# Patient Record
Sex: Female | Born: 1990 | Race: Black or African American | Hispanic: No | Marital: Single | Smoking: Never smoker
Health system: Southern US, Community
[De-identification: ages and names within clinical notes are randomized; demographics above are authoritative.]

## PROBLEM LIST (undated history)

## (undated) DIAGNOSIS — J309 Allergic rhinitis, unspecified: Secondary | ICD-10-CM

## (undated) HISTORY — DX: Allergic rhinitis, unspecified: J30.9

---
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
## 2012-08-20 ENCOUNTER — Ambulatory Visit (INDEPENDENT_AMBULATORY_CARE_PROVIDER_SITE_OTHER): Payer: Managed Care, Other (non HMO) | Admitting: Internal Medicine

## 2012-08-20 ENCOUNTER — Encounter: Payer: Self-pay | Admitting: Internal Medicine

## 2012-08-20 VITALS — BP 114/82 | HR 86 | Temp 98.5°F | Wt 156.0 lb

## 2012-08-20 DIAGNOSIS — J029 Acute pharyngitis, unspecified: Secondary | ICD-10-CM

## 2012-08-20 LAB — POCT RAPID STREP A (OFFICE): Rapid Strep A Screen: NEGATIVE

## 2012-08-20 NOTE — Patient Instructions (Addendum)
This is probably a viral sore throat that will get better on its own with time  Cough may get worse before better. Will let you know about culture results tpo make sure no strep infection.  Gargles tylenol or ibuprofen  type meds for comfort .  If  persistent or progressive  Over weeks or fever returns contact us for re evaluation.

## 2012-08-20 NOTE — Progress Notes (Signed)
Chief Complaint  Patient presents with  . Sore Throat    Was having headache, chest congestion and generalized body aches.  These have resolved.  Sore throat continues.    HPI: Patient comes in today for SDA for  new problem evaluation. Onset  Less  Than a week  4 days onset.  Taking  Alka seltzer cough and cold  Helps with head and body aches.  Sore throat to swallow  And cough    Cough not at niught.  nosterp exposure  [employer] ft and owrks grocery store   No real runny nose  But has body aches  ROS: See pertinent positives and negatives per HPI. No rashes   Past Medical History  Diagnosis Date  . Allergy     Family History  Problem Relation Age of Onset  . Heart disease    . Diabetes    . Allergies    . Nocturnal enuresis      History   Social History  . Marital Status: Single    Spouse Name: N/A    Number of Children: N/A  . Years of Education: N/A   Social History Main Topics  . Smoking status: Never Smoker   . Smokeless tobacco: Never Used  . Alcohol Use: No  . Drug Use: No  . Sexually Active: None   Other Topics Concern  . None   Social History Narrative  . None    Outpatient Encounter Prescriptions as of 08/20/2012  Medication Sig Dispense Refill  . albuterol (PROVENTIL HFA;VENTOLIN HFA) 108 (90 BASE) MCG/ACT inhaler Inhale 2 puffs into the lungs every 6 (six) hours as needed.       No facility-administered encounter medications on file as of 08/20/2012.    EXAM:  BP 114/82  Pulse 86  Temp(Src) 98.5 F (36.9 C) (Oral)  Wt 156 lb (70.761 kg)  SpO2 99%  LMP 08/04/2012  There is no height on file to calculate BMI.  GENERAL: vitals reviewed and listed above, alert, oriented, appears well hydrated and in no acute distress  HEENT: atraumatic, conjunctiva  clear, no obvious abnormalities on inspection of external nose and ears  Face nt  tms clear  OP : no lesion edema or exudate  2 + red  Cobblestoning no petechia  NECK: no obvious masses on inspection  palpation  Shoddy ac nodes and pc nodes  LUNGS: clear to auscultation bilaterally, no wheezes, rales or rhonchi, good air movement Skin nl turgor and no rash . CV: HRRR, no clubbing cyanosis or  peripheral edema nl cap refill  MS: moves all extremities without noticeable focal  abnormality PSYCH: pleasant and cooperative, no obvious depression or anxiety  ASSESSMENT AND PLAN:  Discussed the following assessment and plan:  Acute pharyngitis - Plan: Culture, Group A Strep, POCT rapid strep A  -Patient advised to return or notify health care team  if symptoms worsen or persist or new concerns arise.  Patient Instructions  This is probably a viral sore throat that will get better on its own with time  Cough may get worse before better. Will let you know about culture results tpo make sure no strep infection.  Gargles tylenol or ibuprofen  type meds for comfort .  If  persistent or progressive  Over weeks or fever returns contact us for re evaluation.    Neta Mends. Vito Beg M.D.

## 2012-08-22 LAB — CULTURE, GROUP A STREP: Organism ID, Bacteria: NORMAL

## 2013-06-09 ENCOUNTER — Ambulatory Visit (INDEPENDENT_AMBULATORY_CARE_PROVIDER_SITE_OTHER): Payer: BC Managed Care – PPO | Admitting: Internal Medicine

## 2013-06-09 ENCOUNTER — Ambulatory Visit (INDEPENDENT_AMBULATORY_CARE_PROVIDER_SITE_OTHER)
Admission: RE | Admit: 2013-06-09 | Discharge: 2013-06-09 | Disposition: A | Discharge status: Home / Self Care | Payer: BC Managed Care – PPO | Source: Ambulatory Visit | Attending: Internal Medicine | Admitting: Internal Medicine

## 2013-06-09 ENCOUNTER — Encounter: Payer: Self-pay | Admitting: Internal Medicine

## 2013-06-09 VITALS — BP 120/76 | HR 72 | Temp 98.0°F | Ht 64.75 in | Wt 150.0 lb

## 2013-06-09 DIAGNOSIS — R0602 Shortness of breath: Secondary | ICD-10-CM

## 2013-06-09 MED ORDER — PREDNISONE 20 MG PO TABS: ORAL_TABLET | ORAL | Status: DC

## 2013-06-09 MED ORDER — ALBUTEROL SULFATE HFA 108 (90 BASE) MCG/ACT IN AERS: 2.0000 | INHALATION_SPRAY | Freq: Four times a day (QID) | RESPIRATORY_TRACT | Status: DC | PRN

## 2013-06-09 NOTE — Progress Notes (Signed)
Pre visit review using our clinic review tool, if applicable. No additional management support is needed unless otherwise documented below in the visit note.   Chief Complaint  Patient presents with  . Shortness of Breath    Complains of a tightness in her chest.  Happens at any part of the day.  Ongoing for several weeks.    HPI: Patient comes in today for SDA for  new problem evaluation. Onset about 2 weeks ago and was going tob ed and was hard to breathing  And heart racing  No cough . At initiation but chest heaviness  Now has some nocturnal cough  But no itching sneezing  Just started  A new job [employer].   Could have increase stress Pattern is about every day comes and goes.  No triggers  Not nocturnally ? hasn't really exercised  Tried allergy medicine . Claritin .   Tried albuterol  Temporary help for about 20 - 30 minutes No recent ur allergy sx.  ROS: See pertinent positives and negatives per HPI.  No cp hemoptysis lmp 3 days ago not pregnant  No sig ets. But sister smokes  Albuterol a year ago. Programme researcher, broadcasting/film/videoBathroom  Cleaner irritatve.  Past Medical History  Diagnosis Date  . Allergic rhinitis, cause unspecified     Family History  Problem Relation Age of Onset  . Heart disease    . Diabetes    . Allergies    . Nocturnal enuresis      History   Social History  . Marital Status: Single    Spouse Name: N/A    Number of Children: N/A  . Years of Education: N/A   Social History Main Topics  . Smoking status: Never Smoker   . Smokeless tobacco: Never Used  . Alcohol Use: No  . Drug Use: No  . Sexual Activity: None   Other Topics Concern  . 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 [DISCONTINUED] albuterol (PROVENTIL HFA;VENTOLIN HFA) 108 (90 BASE) MCG/ACT inhaler Inhale 2 puffs into  the lungs every 6 (six) hours as needed.  Marland Kitchen. albuterol (PROAIR HFA) 108 (90 BASE) MCG/ACT inhaler Inhale 2 puffs into the lungs every 6 (six) hours as needed for wheezing or shortness of breath.  . predniSONE (DELTASONE) 20 MG tablet Take 3 po qd for 2 days then 2 po qd for 3 days,or as directed    EXAM:  BP 120/76  Pulse 72  Temp(Src) 98 F (36.7 C) (Oral)  Ht 5' 4.75" (1.645 m)  Wt 150 lb (68.04 kg)  BMI 25.14 kg/m2  SpO2 98%  LMP 06/02/2013  Body mass index is 25.14 kg/(m^2).  GENERAL: vitals reviewed and listed above, alert, oriented, appears well hydrated and in no acute distress HEENT: atraumatic, conjunctiva  clear, no obvious abnormalities on inspection of external nose and ears OP : no lesion edema or exudate  NECK: no obvious masses on inspection palpation  LUNGS: clear to auscultation bilaterally, no wheezes, rales or rhonchi, good air movement  ocass dry cough  CV: HRRR, no g or m  no clubbing cyanosis or  peripheral edema nl cap refill  MS: moves all extremities without noticeable focal  abnormality PSYCH: pleasant and cooperative, no obvious depression or  anxiety  ASSESSMENT AND PLAN:  Discussed the following assessment and plan:  Shortness of breath - prob asthmatic   other causes possible  close fu if persisten or relapsing no evidence of cv  abn on exam  today  - Plan: DG Chest 2 View  -Patient advised to return or notify health care team  if symptoms worsen ,persist or new concerns arise.  Patient Instructions  Get chest x ray today  Will let you know results. Should be normal Treat for a  Form of asthma  5 days of prednisone to decrease  Inflammation in bronchial tubes  That can cause wheezing and asthma symptoms.  Other causes can be stress also . If   persistent or progressive then return for OV  For other management    Neta MendsWanda K. Panosh M.D.

## 2013-06-09 NOTE — Patient Instructions (Signed)
Get chest x ray today  Will let you know results. Should be normal Treat for a  Form of asthma  5 days of prednisone to decrease  Inflammation in bronchial tubes  That can cause wheezing and asthma symptoms.  Other causes can be stress also . If   persistent or progressive then return for OV  For other management

## 2013-06-09 NOTE — Progress Notes (Signed)
Quick Note:  Tell patient that x ray shows no acute abnormality. Proceed with the prednisone And inhaler as we discussed ______

## 2013-06-12 ENCOUNTER — Encounter: Payer: Self-pay | Admitting: Family Medicine

## 2013-06-13 ENCOUNTER — Telehealth: Payer: Self-pay

## 2013-06-13 NOTE — Telephone Encounter (Signed)
Pt aware xray normal

## 2014-05-20 ENCOUNTER — Telehealth: Payer: Self-pay | Admitting: Internal Medicine

## 2014-05-20 MED ORDER — ALBUTEROL SULFATE HFA 108 (90 BASE) MCG/ACT IN AERS: 2.0000 | INHALATION_SPRAY | Freq: Four times a day (QID) | RESPIRATORY_TRACT | Status: DC | PRN

## 2014-05-20 NOTE — Telephone Encounter (Signed)
Refilled for 1 inhaler.

## 2014-05-20 NOTE — Telephone Encounter (Signed)
Pt request refill albuterol (PROAIR HFA) 108 (90 BASE) MCG/ACT inhaler [facility]/ [address]  Advise pt she needed appt, but she wants to check her work first.  Not seen since  05/2013

## 2014-09-27 IMAGING — CR DG CHEST 2V
2 series · 2 of 2 positions shown · non-contrast
Comparison: None.

CLINICAL DATA: Cough, shortness of Breath

EXAM:
CHEST  2 VIEW

[view not recorded (1 of 2)]
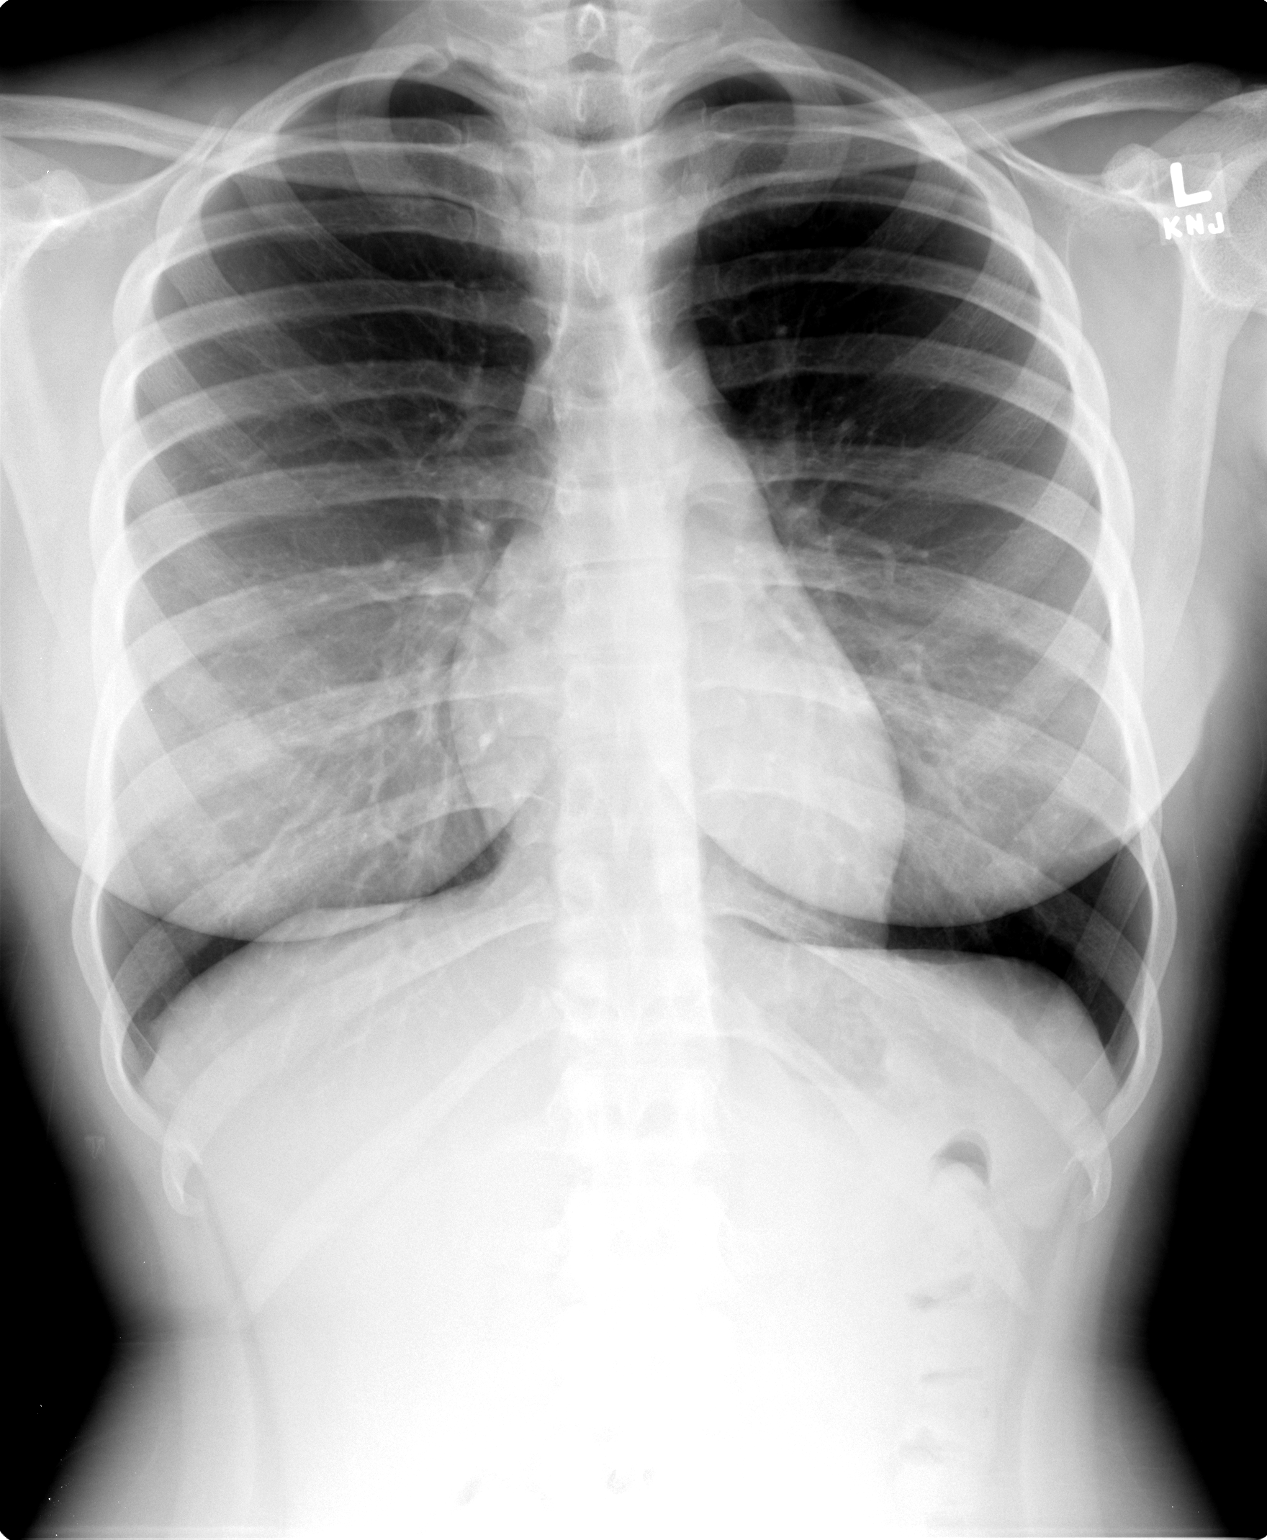

[view not recorded (2 of 2)]
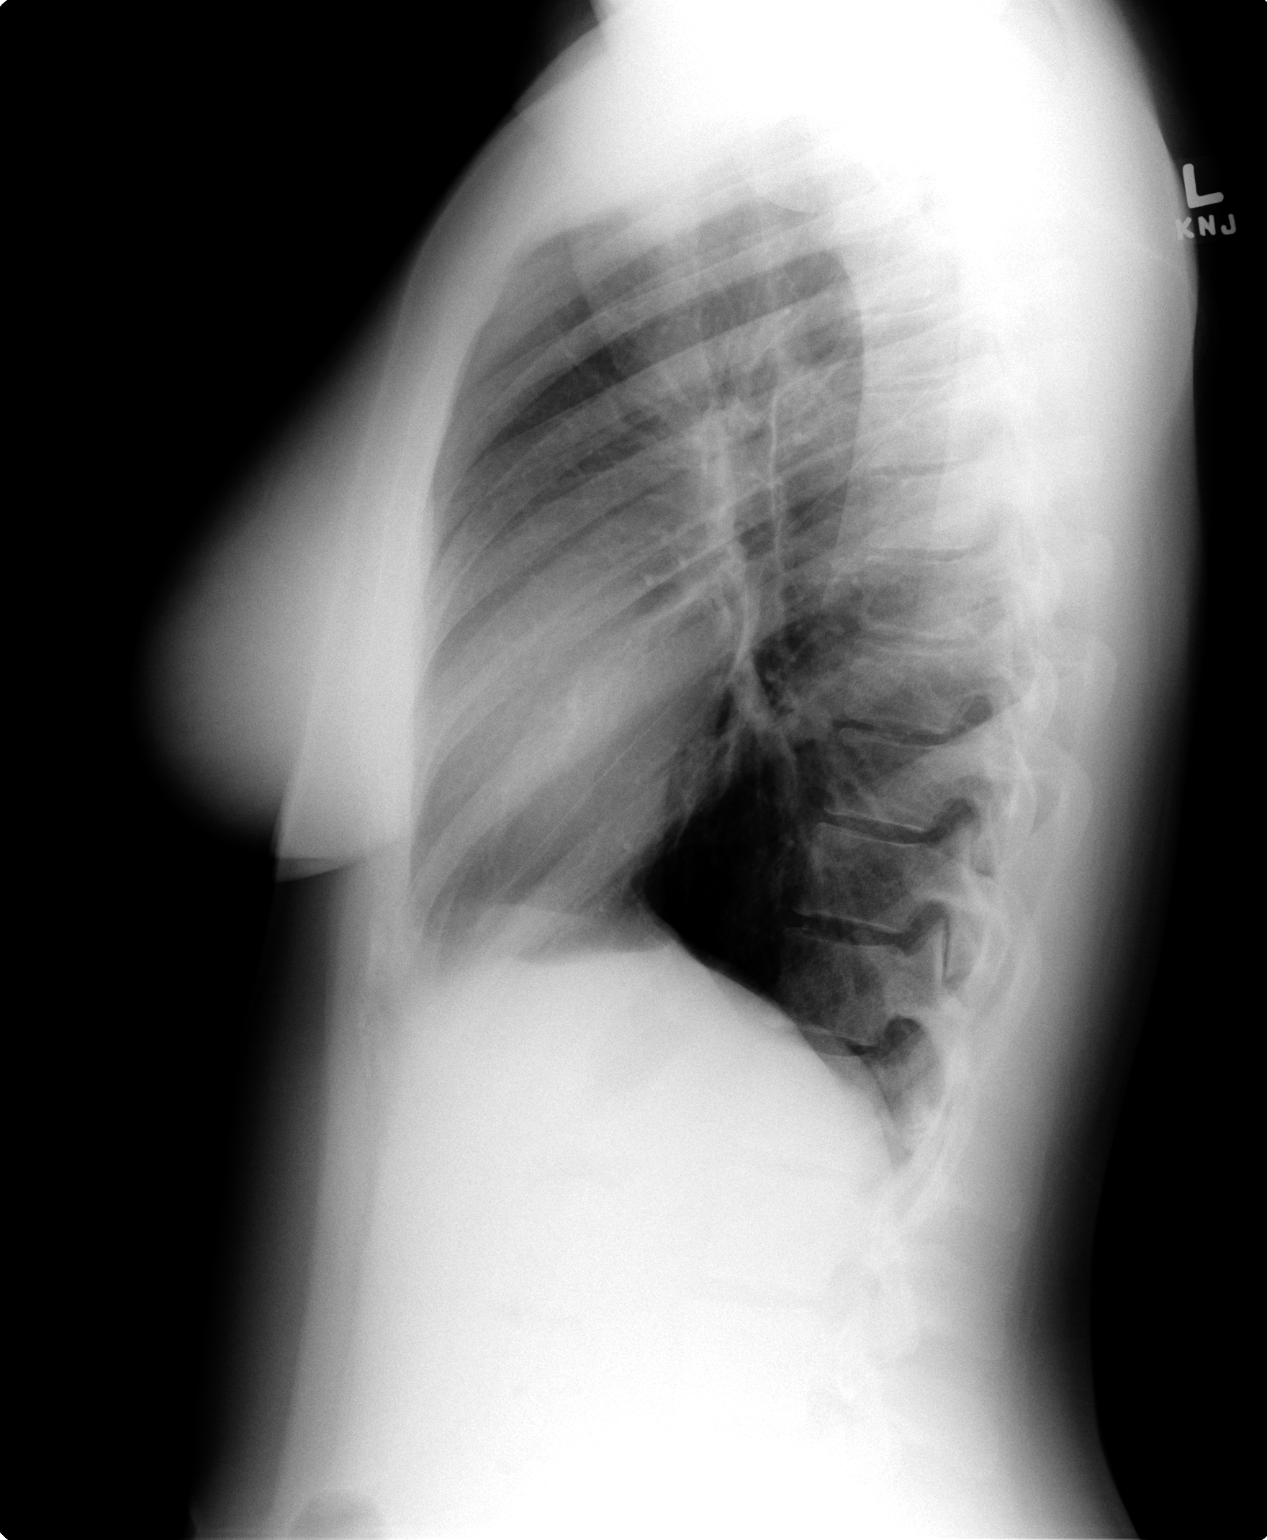

[2 of 2 positions shown; findings below may reference images not displayed]

FINDINGS: Cardiomediastinal silhouette is unremarkable. No acute infiltrate or
pleural effusion. No pulmonary edema. Bony thorax is unremarkable.
IMPRESSION: No active cardiopulmonary disease.

## 2015-05-29 ENCOUNTER — Emergency Department (HOSPITAL_COMMUNITY)
Admission: EM | Admit: 2015-05-29 | Discharge: 2015-05-30 | Disposition: A | Discharge status: Home / Self Care | Payer: BLUE CROSS/BLUE SHIELD | Attending: Emergency Medicine | Admitting: Emergency Medicine

## 2015-05-29 ENCOUNTER — Encounter (HOSPITAL_COMMUNITY): Payer: Self-pay | Admitting: Oncology

## 2015-05-29 DIAGNOSIS — Z79899 Other long term (current) drug therapy: Secondary | ICD-10-CM | POA: Insufficient documentation

## 2015-05-29 DIAGNOSIS — R21 Rash and other nonspecific skin eruption: Secondary | ICD-10-CM | POA: Diagnosis present

## 2015-05-29 DIAGNOSIS — L509 Urticaria, unspecified: Secondary | ICD-10-CM | POA: Diagnosis not present

## 2015-05-29 NOTE — ED Notes (Signed)
Pt presents to the ED with c/o of allergic reaction and hives Pt states that yesterday she had soy milk and then started having the reaction Pt took Benadryl this morning with some relief Pt is not experiencing SOB, difficulty swallowing/speaking, or tongue swelling Pt does have hives to her face and arms Pt in NAD

## 2015-05-30 MED ORDER — DIPHENHYDRAMINE HCL 25 MG PO TABS: 25.0000 mg | ORAL_TABLET | Freq: Four times a day (QID) | ORAL | Status: AC | PRN

## 2015-05-30 MED ORDER — FAMOTIDINE 20 MG PO TABS
20.0000 mg | ORAL_TABLET | Freq: Once | ORAL | Status: AC
  Administered 2015-05-30: 20 mg via ORAL
  Filled 2015-05-30: qty 1

## 2015-05-30 MED ORDER — FAMOTIDINE 20 MG PO TABS: 20.0000 mg | ORAL_TABLET | Freq: Two times a day (BID) | ORAL | Status: AC

## 2015-05-30 MED ORDER — PREDNISONE 20 MG PO TABS: 40.0000 mg | ORAL_TABLET | Freq: Every day | ORAL | Status: AC

## 2015-05-30 MED ORDER — DIPHENHYDRAMINE HCL 25 MG PO CAPS
25.0000 mg | ORAL_CAPSULE | Freq: Once | ORAL | Status: AC
  Administered 2015-05-30: 25 mg via ORAL
  Filled 2015-05-30: qty 1

## 2015-05-30 MED ORDER — PREDNISONE 20 MG PO TABS
60.0000 mg | ORAL_TABLET | Freq: Once | ORAL | Status: AC
  Administered 2015-05-30: 60 mg via ORAL
  Filled 2015-05-30: qty 3

## 2015-05-30 MED ORDER — HYDROCORTISONE 2.5 % EX LOTN: TOPICAL_LOTION | Freq: Two times a day (BID) | CUTANEOUS | Status: AC

## 2015-05-30 NOTE — Discharge Instructions (Signed)
1. Medications: Prednisone, Benadryl, Pepcid, hydrocortisone lotion, usual home medications 2. Treatment: rest, drink plenty of fluids, take medications as prescribed 3. Follow Up: Please followup with your primary doctor in 3 days for discussion of your diagnoses and further evaluation after today's visit; if you do not have a primary care doctor use the resource guide provided to find one; followup with dermatology as needed; Return to the ER for difficulty breathing, return of allergic reaction or other concerning symptoms    Hives Hives are itchy, red, swollen areas of the skin. They can vary in size and location on your body. Hives can come and go for hours or several days (acute hives) or for several weeks (chronic hives). Hives do not spread from person to person (noncontagious). They may get worse with scratching, exercise, and emotional stress. CAUSES   Allergic reaction to food, additives, or drugs.  Infections, including the common cold.  Illness, such as vasculitis, lupus, or thyroid disease.  Exposure to sunlight, heat, or cold.  Exercise.  Stress.  Contact with chemicals. SYMPTOMS   Red or white swollen patches on the skin. The patches may change size, shape, and location quickly and repeatedly.  Itching.  Swelling of the hands, feet, and face. This may occur if hives develop deeper in the skin. DIAGNOSIS  Your caregiver can usually tell what is wrong by performing a physical exam. Skin or blood tests may also be done to determine the cause of your hives. In some cases, the cause cannot be determined. TREATMENT  Mild cases usually get better with medicines such as antihistamines. Severe cases may require an emergency epinephrine injection. If the cause of your hives is known, treatment includes avoiding that trigger.  HOME CARE INSTRUCTIONS   Avoid causes that trigger your hives.  Take antihistamines as directed by your caregiver to reduce the severity of your  hives. Non-sedating or low-sedating antihistamines are usually recommended. Do not drive while taking an antihistamine.  Take any other medicines prescribed for itching as directed by your caregiver.  Wear loose-fitting clothing.  Keep all follow-up appointments as directed by your caregiver. SEEK MEDICAL CARE IF:   You have persistent or severe itching that is not relieved with medicine.  You have painful or swollen joints. SEEK IMMEDIATE MEDICAL CARE IF:   You have a fever.  Your tongue or lips are swollen.  You have trouble breathing or swallowing.  You feel tightness in the throat or chest.  You have abdominal pain. These problems may be the first sign of a life-threatening allergic reaction. Call your local emergency services (911 in [place] MAKE SURE YOU:   Understand these instructions.  Will watch your condition.  Will get help right away if you are not doing well or get worse.   This information is not intended to replace advice given to you by your health care provider. Make sure you discuss any questions you have with your health care provider.   Document Released: 02/06/2005 Document Revised: 02/11/2013 Document Reviewed: 05/02/2011 Elsevier Interactive Patient Education [employer]2016 Elsevier Inc.

## 2015-05-30 NOTE — ED Provider Notes (Signed)
CSN: [id number]     Arrival date & time 05/29/15  2309 History   First MD Initiated Contact with Patient 05/30/15 0008     Chief Complaint  Patient presents with  . Allergic Reaction  . Urticaria     (Consider location/radiation/quality/duration/timing/severity/associated sxs/prior Treatment) The history is provided by the patient and medical records. No language interpreter was used.     Melanie Bradford is a 25 y.o. female  with a hx of allergic rhinitis presents to the Emergency Department complaining of gradual, persistent, progressively worsening rash and itching onset 10pm last night, 2 hours after drinking soy milk.  She reports she has had soymilk in the past without a reaction.  She reports taking benadryl which improves the symptoms, but they return in 3-4 hours as the medication begins to wear off.  Pt denies known allergies.  Denies difficulty breathing, swallowing, vomiting, wheezing.  She reports no other associated symptoms.  Nothing makes the symptoms worse. She reports she does not have new hair, makeup, laundry or other environmental products.    Past Medical History  Diagnosis Date  . Allergic rhinitis, cause unspecified    History reviewed. No pertinent past surgical history. Family History  Problem Relation Age of Onset  . Heart disease    . Diabetes    . Allergies    . Nocturnal enuresis     Social History  Substance Use Topics  . Smoking status: Never Smoker   . Smokeless tobacco: Never Used  . Alcohol Use: Yes   OB History    No data available     Review of Systems  Constitutional: Negative for fever, diaphoresis, appetite change, fatigue and unexpected weight change.  HENT: Negative for mouth sores.   Eyes: Negative for visual disturbance.  Respiratory: Negative for cough, chest tightness, shortness of breath and wheezing.   Cardiovascular: Negative for chest pain.  Gastrointestinal: Negative for nausea, vomiting, abdominal pain, diarrhea and  constipation.  Endocrine: Negative for polydipsia, polyphagia and polyuria.  Genitourinary: Negative for dysuria, urgency, frequency and hematuria.  Musculoskeletal: Negative for back pain and neck stiffness.  Skin: Positive for rash.  Allergic/Immunologic: Negative for immunocompromised state.  Neurological: Negative for syncope, light-headedness and headaches.  Hematological: Does not bruise/bleed easily.  Psychiatric/Behavioral: Negative for sleep disturbance. The patient is not nervous/anxious.       Allergies  Review of patient's allergies indicates no known allergies.  Home Medications   Prior to Admission medications   Medication Sig Start Date End Date Taking? Authorizing Provider  hydroxypropyl methylcellulose / hypromellose (ISOPTO TEARS / GONIOVISC) 2.5 % ophthalmic solution Place 1 drop into both eyes 3 (three) times daily as needed for dry eyes.   Yes Historical Provider, MD  ibuprofen (ADVIL,MOTRIN) 200 MG tablet Take 400 mg by mouth every 6 (six) hours as needed for moderate pain.   Yes Historical Provider, MD  Multiple Vitamin (MULTIVITAMIN WITH MINERALS) TABS tablet Take 1 tablet by mouth daily.   Yes Historical Provider, MD  diphenhydrAMINE (BENADRYL) 25 MG tablet Take 1 tablet (25 mg total) by mouth every 6 (six) hours as needed for itching (Rash). 05/30/15   Bellatrix Devonshire, PA-C  famotidine (PEPCID) 20 MG tablet Take 1 tablet (20 mg total) by mouth 2 (two) times daily. 05/30/15   Bambie Pizzolato, PA-C  hydrocortisone 2.5 % lotion Apply topically 2 (two) times daily. 05/30/15   Emelin Dascenzo, PA-C  predniSONE (DELTASONE) 20 MG tablet Take 2 tablets (40 mg total) by mouth daily. 05/30/15  Diontre Harps, PA-C   BP 126/70 mmHg  Pulse 69  Temp(Src) 98.8 F (37.1 C) (Oral)  Resp 15  Ht  (1.626 m)  Wt 72.576 kg  BMI 27.45 kg/m2  SpO2 100%  LMP 05/09/2015 Physical Exam  Constitutional: She is oriented to person, place, and time. She appears  well-developed and well-nourished. No distress.  HENT:  Head: Normocephalic and atraumatic.  Right Ear: Tympanic membrane, external ear and ear canal normal.  Left Ear: Tympanic membrane, external ear and ear canal normal.  Nose: Nose normal. No mucosal edema or rhinorrhea.  Mouth/Throat: Uvula is midline. No uvula swelling. No oropharyngeal exudate, posterior oropharyngeal edema, posterior oropharyngeal erythema or tonsillar abscesses.  No swelling of the uvula or oropharynx   Eyes: Conjunctivae are normal.  Neck: Normal range of motion.  Patent airway No stridor; normal phonation Handling secretions without difficulty  Cardiovascular: Normal rate, normal heart sounds and intact distal pulses.   No murmur heard. Pulmonary/Chest: Effort normal and breath sounds normal. No stridor. No respiratory distress. She has no wheezes.  No wheezes or rhonchi  Abdominal: Soft. Bowel sounds are normal. There is no tenderness.  Musculoskeletal: Normal range of motion. She exhibits no edema.  Neurological: She is alert and oriented to person, place, and time.  Skin: Skin is warm and dry. Rash noted. She is not diaphoretic.  Urticaria noted Mild excoriations - no induration or fluctuance to indicate secondary infection  Psychiatric: She has a normal mood and affect.  Nursing note and vitals reviewed.   ED Course  Procedures (including critical care time)   MDM   Final diagnoses:  Urticaria   Melanie Bradford presents with urticaria x24 hours intermittently improved with benadryl.  No airway involvement.  No 2nd system.  No evidence of anaphylaxis.    1:01 AM Patient re-evaluated prior to dc, is hemodynamically stable, in no respiratory distress, and denies the feeling of throat closing. Pt has been advised to take OTC benadryl, pepcid, prednisone and use hydrocortisone & return to the ED if they have a mod-severe allergic rxn (s/s including throat closing, difficulty breathing, swelling of  lips face or tongue). Pt is to follow up with their PCP. Pt is agreeable with plan & verbalizes understanding.    Dierdre Forth, PA-C 05/30/15 0104  April Palumbo, MD 05/30/15 [phone]

## 2015-05-31 ENCOUNTER — Telehealth: Payer: Self-pay | Admitting: Internal Medicine

## 2015-05-31 NOTE — Telephone Encounter (Signed)
Pt need a ER fup Dr Fabian SharpPanosh does not have 30 minutes available except same day. Do I use those?

## 2015-05-31 NOTE — Telephone Encounter (Signed)
Ok as long as there are 3 sdas left that day  Or 2 on a thursday

## 2015-06-02 NOTE — Telephone Encounter (Signed)
lmovm to call and schedule  °

## 2022-11-08 ENCOUNTER — Emergency Department (HOSPITAL_BASED_OUTPATIENT_CLINIC_OR_DEPARTMENT_OTHER)
Admission: EM | Admit: 2022-11-08 | Discharge: 2022-11-08 | Discharge status: Against Medical Advice | Payer: Self-pay | Attending: Respiratory Therapy | Admitting: Respiratory Therapy

## 2022-11-08 ENCOUNTER — Other Ambulatory Visit: Payer: Self-pay

## 2022-11-08 ENCOUNTER — Encounter (HOSPITAL_BASED_OUTPATIENT_CLINIC_OR_DEPARTMENT_OTHER): Payer: Self-pay | Admitting: Emergency Medicine

## 2022-11-08 DIAGNOSIS — R0789 Other chest pain: Secondary | ICD-10-CM | POA: Insufficient documentation

## 2022-11-08 DIAGNOSIS — R0602 Shortness of breath: Secondary | ICD-10-CM | POA: Insufficient documentation

## 2022-11-08 DIAGNOSIS — Z5321 Procedure and treatment not carried out due to patient leaving prior to being seen by health care provider: Secondary | ICD-10-CM | POA: Insufficient documentation

## 2022-11-08 DIAGNOSIS — R059 Cough, unspecified: Secondary | ICD-10-CM | POA: Insufficient documentation

## 2022-11-08 NOTE — ED Triage Notes (Signed)
Pt to ER with c/o shortness of breath, chest tightness and cough that started around 9pm last night. States she is starting feel better at this time.  States she moved recently and just brought her cat to the new house last night.  Pt in NAD at this time.

## 2022-11-08 NOTE — ED Notes (Signed)
Seen before triage. Hx RAD as a child, no SABA/HFA currently.  No increase work of breathing noted, speaking in complete sentences.
